# Patient Record
Sex: Male | Born: 1990 | Race: Black or African American | Hispanic: No | Marital: Single | Smoking: Never smoker
Health system: Southern US, Community
[De-identification: ages and names within clinical notes are randomized; demographics above are authoritative.]

## PROBLEM LIST (undated history)

## (undated) HISTORY — PX: OTHER SURGICAL HISTORY: SHX169

---

## 2013-03-01 ENCOUNTER — Emergency Department (HOSPITAL_COMMUNITY)
Admission: EM | Admit: 2013-03-01 | Discharge: 2013-03-01 | Disposition: A | Discharge status: Home / Self Care | Payer: BC Managed Care – PPO | Attending: Urology | Admitting: Urology

## 2013-03-01 ENCOUNTER — Encounter (HOSPITAL_COMMUNITY): Payer: Self-pay | Admitting: *Deleted

## 2013-03-01 ENCOUNTER — Encounter (HOSPITAL_COMMUNITY): Payer: Self-pay | Admitting: Certified Registered Nurse Anesthetist

## 2013-03-01 ENCOUNTER — Encounter (HOSPITAL_COMMUNITY)
Admission: EM | Disposition: A | Discharge status: Home / Self Care | Payer: Self-pay | Source: Home / Self Care | Attending: Emergency Medicine

## 2013-03-01 ENCOUNTER — Ambulatory Visit (INDEPENDENT_AMBULATORY_CARE_PROVIDER_SITE_OTHER): Payer: BC Managed Care – PPO | Admitting: Emergency Medicine

## 2013-03-01 ENCOUNTER — Emergency Department (HOSPITAL_COMMUNITY): Payer: BC Managed Care – PPO | Admitting: Certified Registered Nurse Anesthetist

## 2013-03-01 ENCOUNTER — Ambulatory Visit (HOSPITAL_COMMUNITY)
Admission: RE | Admit: 2013-03-01 | Discharge: 2013-03-01 | Disposition: A | Discharge status: Home / Self Care | Payer: BC Managed Care – PPO | Source: Ambulatory Visit | Attending: Emergency Medicine | Admitting: Emergency Medicine

## 2013-03-01 VITALS — BP 155/79 | HR 61 | Temp 98.5°F | Resp 16 | Ht 69.0 in | Wt 179.0 lb

## 2013-03-01 DIAGNOSIS — N4400 Torsion of testis, unspecified: Secondary | ICD-10-CM

## 2013-03-01 DIAGNOSIS — N50819 Testicular pain, unspecified: Secondary | ICD-10-CM

## 2013-03-01 DIAGNOSIS — F1210 Cannabis abuse, uncomplicated: Secondary | ICD-10-CM | POA: Insufficient documentation

## 2013-03-01 DIAGNOSIS — N509 Disorder of male genital organs, unspecified: Secondary | ICD-10-CM

## 2013-03-01 HISTORY — PX: SCROTAL EXPLORATION: SHX2386

## 2013-03-01 HISTORY — PX: ORCHIOPEXY: SHX479

## 2013-03-01 HISTORY — PX: ORCHIECTOMY: SHX2116

## 2013-03-01 LAB — POCT UA - MICROSCOPIC ONLY
Casts, Ur, LPF, POC: NEGATIVE
Yeast, UA: NEGATIVE

## 2013-03-01 LAB — CBC WITH DIFFERENTIAL/PLATELET
Hemoglobin: 16.1 g/dL (ref 13.0–17.0)
Lymphocytes Relative: 25 % (ref 12–46)
Lymphs Abs: 1.3 10*3/uL (ref 0.7–4.0)
MCH: 29.3 pg (ref 26.0–34.0)
Monocytes Relative: 12 % (ref 3–12)
Neutro Abs: 3.2 10*3/uL (ref 1.7–7.7)
Neutrophils Relative %: 63 % (ref 43–77)
Platelets: 175 10*3/uL (ref 150–400)
RBC: 5.5 MIL/uL (ref 4.22–5.81)
WBC: 5.1 10*3/uL (ref 4.0–10.5)

## 2013-03-01 LAB — POCT URINALYSIS DIPSTICK
Bilirubin, UA: NEGATIVE
Glucose, UA: NEGATIVE
Leukocytes, UA: NEGATIVE
Nitrite, UA: NEGATIVE
Urobilinogen, UA: 1

## 2013-03-01 LAB — BASIC METABOLIC PANEL
BUN: 9 mg/dL (ref 6–23)
CO2: 30 mEq/L (ref 19–32)
Chloride: 100 mEq/L (ref 96–112)
Glucose, Bld: 91 mg/dL (ref 70–99)
Potassium: 3.2 mEq/L — ABNORMAL LOW (ref 3.5–5.1)
Sodium: 139 mEq/L (ref 135–145)

## 2013-03-01 LAB — POCT CBC
Lymph, poc: 1.4 (ref 0.6–3.4)
MCH, POC: 28.4 pg (ref 27–31.2)
MCHC: 31.6 g/dL — AB (ref 31.8–35.4)
MID (cbc): 0.6 (ref 0–0.9)
MPV: 9.3 fL (ref 0–99.8)
POC Granulocyte: 3.2 (ref 2–6.9)
POC LYMPH PERCENT: 26.6 %L (ref 10–50)
POC MID %: 11.2 %M (ref 0–12)
Platelet Count, POC: 235 10*3/uL (ref 142–424)
RDW, POC: 12.4 %
WBC: 5.1 10*3/uL (ref 4.6–10.2)

## 2013-03-01 SURGERY — EXPLORATION, SCROTUM
Anesthesia: General | Site: Scrotum | Laterality: Right | Wound class: Clean Contaminated

## 2013-03-01 MED ORDER — SUCCINYLCHOLINE CHLORIDE 20 MG/ML IJ SOLN
INTRAMUSCULAR | Status: DC | PRN
  Administered 2013-03-01: 100 mg via INTRAVENOUS

## 2013-03-01 MED ORDER — MORPHINE SULFATE 4 MG/ML IJ SOLN
4.0000 mg | Freq: Once | INTRAMUSCULAR | Status: AC
  Administered 2013-03-01: 4 mg via INTRAVENOUS
  Filled 2013-03-01: qty 1

## 2013-03-01 MED ORDER — FENTANYL CITRATE 0.05 MG/ML IJ SOLN
INTRAMUSCULAR | Status: DC | PRN
  Administered 2013-03-01: 100 ug via INTRAVENOUS
  Administered 2013-03-01 (×3): 50 ug via INTRAVENOUS

## 2013-03-01 MED ORDER — OXYCODONE-ACETAMINOPHEN 5-325 MG PO TABS
ORAL_TABLET | ORAL | Status: AC
  Administered 2013-03-01: 1
  Filled 2013-03-01: qty 1

## 2013-03-01 MED ORDER — OXYCODONE-ACETAMINOPHEN 10-325 MG PO TABS: 1.0000 | ORAL_TABLET | ORAL | Status: DC | PRN

## 2013-03-01 MED ORDER — PROPOFOL 10 MG/ML IV BOLUS
INTRAVENOUS | Status: DC | PRN
  Administered 2013-03-01: 200 mg via INTRAVENOUS

## 2013-03-01 MED ORDER — LACTATED RINGERS IV SOLN
INTRAVENOUS | Status: DC | PRN
  Administered 2013-03-01: 19:00:00 via INTRAVENOUS

## 2013-03-01 MED ORDER — HYDROMORPHONE HCL PF 1 MG/ML IJ SOLN: 0.2500 mg | INTRAMUSCULAR | Status: DC | PRN

## 2013-03-01 MED ORDER — 0.9 % SODIUM CHLORIDE (POUR BTL) OPTIME
TOPICAL | Status: DC | PRN
  Administered 2013-03-01: 1000 mL

## 2013-03-01 MED ORDER — OXYCODONE HCL 5 MG PO TABS
ORAL_TABLET | ORAL | Status: AC
  Administered 2013-03-01: 5 mg via ORAL
  Filled 2013-03-01: qty 1

## 2013-03-01 MED ORDER — ONDANSETRON HCL 4 MG/2ML IJ SOLN
4.0000 mg | Freq: Once | INTRAMUSCULAR | Status: AC
  Administered 2013-03-01: 4 mg via INTRAVENOUS
  Filled 2013-03-01: qty 2

## 2013-03-01 MED ORDER — BUPIVACAINE-EPINEPHRINE 0.5% -1:200000 IJ SOLN
INTRAMUSCULAR | Status: DC | PRN
  Administered 2013-03-01: 10 mL

## 2013-03-01 MED ORDER — CEFAZOLIN SODIUM-DEXTROSE 2-3 GM-% IV SOLR
2.0000 g | Freq: Once | INTRAVENOUS | Status: DC
  Filled 2013-03-01: qty 50

## 2013-03-01 MED ORDER — MIDAZOLAM HCL 5 MG/5ML IJ SOLN
INTRAMUSCULAR | Status: DC | PRN
  Administered 2013-03-01: 2 mg via INTRAVENOUS

## 2013-03-01 MED ORDER — LIDOCAINE HCL (CARDIAC) 20 MG/ML IV SOLN
INTRAVENOUS | Status: DC | PRN
  Administered 2013-03-01: 40 mg via INTRAVENOUS

## 2013-03-01 MED ORDER — OXYCODONE HCL 5 MG PO TABS: 5.0000 mg | ORAL_TABLET | Freq: Once | ORAL | Status: AC | PRN

## 2013-03-01 MED ORDER — HYDROMORPHONE HCL PF 1 MG/ML IJ SOLN
INTRAMUSCULAR | Status: AC
  Administered 2013-03-01: 0.5 mg via INTRAVENOUS
  Filled 2013-03-01: qty 1

## 2013-03-01 MED ORDER — OXYCODONE HCL 5 MG/5ML PO SOLN: 5.0000 mg | Freq: Once | ORAL | Status: AC | PRN

## 2013-03-01 SURGICAL SUPPLY — 32 items
BAG URO CATCHER STRL LF (DRAPE) ×5 IMPLANT
BANDAGE GAUZE ELAST BULKY 4 IN (GAUZE/BANDAGES/DRESSINGS) ×5 IMPLANT
BLADE SURG 15 STRL LF DISP TIS (BLADE) ×8 IMPLANT
BLADE SURG 15 STRL SS (BLADE) ×2
CANISTER SUCTION 2500CC (MISCELLANEOUS) ×5 IMPLANT
CLOTH BEACON ORANGE TIMEOUT ST (SAFETY) ×5 IMPLANT
CONT SPEC 4OZ CLIKSEAL STRL BL (MISCELLANEOUS) ×5 IMPLANT
DRAPE LAPAROTOMY TRNSV 102X78 (DRAPE) ×5 IMPLANT
DRSG PAD ABDOMINAL 8X10 ST (GAUZE/BANDAGES/DRESSINGS) ×10 IMPLANT
ELECT REM PT RETURN 9FT ADLT (ELECTROSURGICAL) ×5
ELECTRODE REM PT RTRN 9FT ADLT (ELECTROSURGICAL) ×4 IMPLANT
GLOVE BIO SURGEON STRL SZ7.5 (GLOVE) ×5 IMPLANT
GLOVE BIO SURGEON STRL SZ8 (GLOVE) ×5 IMPLANT
GLOVE ORTHO TXT STRL SZ7.5 (GLOVE) ×5 IMPLANT
GOWN PREVENTION PLUS XLARGE (GOWN DISPOSABLE) ×5 IMPLANT
KIT BASIN OR (CUSTOM PROCEDURE TRAY) ×5 IMPLANT
KIT ROOM TURNOVER OR (KITS) ×5 IMPLANT
NEEDLE HYPO 25GX1X1/2 BEV (NEEDLE) ×5 IMPLANT
NS IRRIG 1000ML POUR BTL (IV SOLUTION) ×5 IMPLANT
PACK CYSTO (CUSTOM PROCEDURE TRAY) IMPLANT
PACK GENERAL/GYN (CUSTOM PROCEDURE TRAY) ×5 IMPLANT
PAD ARMBOARD 7.5X6 YLW CONV (MISCELLANEOUS) ×10 IMPLANT
SOLUTION BETADINE 4OZ (MISCELLANEOUS) ×5 IMPLANT
SPONGE GAUZE 4X4 12PLY (GAUZE/BANDAGES/DRESSINGS) ×5 IMPLANT
SPONGE LAP 18X18 X RAY DECT (DISPOSABLE) IMPLANT
SUT CHROMIC 3 0 SH 27 (SUTURE) ×10 IMPLANT
SUT SILK 3 0 SH 30 (SUTURE) ×30 IMPLANT
SWAB COLLECTION DEVICE MRSA (MISCELLANEOUS) IMPLANT
SYR CONTROL 10ML LL (SYRINGE) ×5 IMPLANT
TOWEL OR 17X24 6PK STRL BLUE (TOWEL DISPOSABLE) ×5 IMPLANT
TOWEL OR 17X26 10 PK STRL BLUE (TOWEL DISPOSABLE) ×5 IMPLANT
WATER STERILE IRR 1000ML POUR (IV SOLUTION) IMPLANT

## 2013-03-01 NOTE — Preoperative (Signed)
Beta Blockers   Reason not to administer Beta Blockers:Not Applicable 

## 2013-03-01 NOTE — Progress Notes (Signed)
[facility] [address], [place] [address] [phone]  Date:  03/01/2013   Name:  Nijah Orlich   DOB:  March 21, 1991   MRN:  [id number]  PCP:  No PCP Per Patient    Chief Complaint: Testicle Pain and Emesis   History of Present Illness:  Beth Spackman is a 22 y.o. very pleasant male patient who presents with the following:  Awakened in night with pain in testicle.  Says that he began to vomit 4-5 times and then stopped. No nausea today.  Has no stool change.  Denies fever or chills.  No urethral discharge, dysuria.  No hesitancy or frequency or urgency.  No new sexual contacts recently.  No improvement with over the counter medications or other home remedies. Denies other complaint or health concern today.   There is no problem list on file for this patient.   History reviewed. No pertinent past medical history.  Past Surgical History  Procedure Laterality Date  . Tosillectomy Bilateral     History  Substance Use Topics  . Smoking status: Never Smoker   . Smokeless tobacco: Not on file  . Alcohol Use: Yes    History reviewed. No pertinent family history.  No Known Allergies  Medication list has been reviewed and updated.  No current outpatient prescriptions on file prior to visit.   No current facility-administered medications on file prior to visit.    Review of Systems:  As per HPI, otherwise negative.    Physical Examination: Filed Vitals:   03/01/13 1500  BP: 155/79  Pulse: 61  Temp: 98.5 F (36.9 C)  Resp: 16   Filed Vitals:   03/01/13 1500  Height: 5\' 9"  (1.753 m)  Weight: 179 lb (81.194 kg)   Body mass index is 26.42 kg/(m^2). Ideal Body Weight: Weight in (lb) to have BMI = 25: 168.9   GEN: WDWN, NAD, Non-toxic, Alert & Oriented x 3 HEENT: Atraumatic, Normocephalic.  Ears and Nose: No external deformity. EXTR: No clubbing/cyanosis/edema NEURO: Normal gait.  PSYCH: Normally interactive. Conversant. Not  depressed or anxious appearing.  Calm demeanor.  Genitalia:  Large tender, firm left testicle not able to distinguish between testicle and epididymis.  Cord thicker and tender. Left testicle high in scrotum  Assessment and Plan: Torsion vs epididymis Ultrasound   Signed,  Phillips Odor, MD   Sent to ER for admission after receipt of sono report.  Has torsion since midnight (17 hours.)

## 2013-03-01 NOTE — H&P (Signed)
John Cooke is an 22 y.o. male.    HPI:  He reports awakening at 12:30 AM this morning with left testicular pain. It was severe and caused him to become nauseated and he then vomited. After vomiting he said the pain seemed to improve somewhat he went back to sleep. When he awoke later in the morning he noted he was still having left testicular pain and swelling. He went to an urgent care where a scrotal ultrasound was obtained. This revealed absence of flow to the left testicle but otherwise no significant testicular pathology was identified. He continues to have a moderate amount of pain and some swelling. He has no prior history of testicular pain, significant trauma or infection. He denies any voiding symptoms and has never had a UTI. His pain is currently mild to moderate in severity. It is not modified by positional change. No other associated signs or symptoms.  History reviewed. No pertinent past medical history.  Past Surgical History  Procedure Laterality Date  . Tosillectomy Bilateral   He also had his wisdom teeth removed. Medications: None Allergies: No Known Allergies  No family history on file.  Social History:  reports that he has never smoked. He does not have any smokeless tobacco history on file. He reports that  drinks alcohol. He reports that he uses illicit drugs (Marijuana).  Review of Systems: Pertinent items are noted in HPI. A comprehensive review of systems was negative except for: as noted above  Results for orders placed in visit on 03/01/13 (from the past 48 hour(s))  POCT CBC     Status: Abnormal   Collection Time    03/01/13  3:28 PM      Result Value Range   WBC 5.1  4.6 - 10.2 K/uL   Lymph, poc 1.4  0.6 - 3.4   POC LYMPH PERCENT 26.6  10 - 50 %L   MID (cbc) 0.6  0 - 0.9   POC MID % 11.2  0 - 12 %M   POC Granulocyte 3.2  2 - 6.9   Granulocyte percent 62.2  37 - 80 %G   RBC 5.64  4.69 - 6.13 M/uL   Hemoglobin 16.0  14.1 - 18.1 g/dL   HCT, POC  47.8  29.5 - 53.7 %   MCV 89.7  80 - 97 fL   MCH, POC 28.4  27 - 31.2 pg   MCHC 31.6 (*) 31.8 - 35.4 g/dL   RDW, POC 62.1     Platelet Count, POC 235  142 - 424 K/uL   MPV 9.3  0 - 99.8 fL  POCT URINALYSIS DIPSTICK     Status: None   Collection Time    03/01/13  3:29 PM      Result Value Range   Color, UA yellow     Clarity, UA clear     Glucose, UA negative     Bilirubin, UA negative     Ketones, UA negative     Spec Grav, UA 1.020     Blood, UA negative     pH, UA 7.5     Protein, UA trace     Urobilinogen, UA 1.0     Nitrite, UA negative     Leukocytes, UA Negative    POCT UA - MICROSCOPIC ONLY     Status: None   Collection Time    03/01/13  3:33 PM      Result Value Range   WBC, Ur, HPF, POC 0-1  RBC, urine, microscopic 1-2     Bacteria, U Microscopic negative     Mucus, UA trace     Epithelial cells, urine per micros 0-1     Crystals, Ur, HPF, POC negative     Casts, Ur, LPF, POC negative     Yeast, UA negative      US Scrotum  03/01/2013  *RADIOLOGY REPORT*  Clinical Data:  Left testicular pain  SCROTAL ULTRASOUND DOPPLER ULTRASOUND OF THE TESTICLES  Technique: Complete ultrasound examination of the testicles, epididymis, and other scrotal structures was performed.  Color and spectral Doppler ultrasound were also utilized to evaluate blood flow to the testicles.  Comparison:  None.  Findings:  Right testis:  Normal in size appearance, measuring 4.9 x 2.2 x 2.9 cm.  Left testis:  Mildly enlarged with heterogeneous echotexture, measuring 4.8 x 2.8 x 3.7 cm.  Right epididymis:  Normal in size and appearance.  Left epididymis:  Normal in size and appearance.  Hydrocele:  Absent.  Varicocele:  Absent.  Pulsed Doppler interrogation of both testes demonstrates normal low resistance flow in the right testis.  Lack of demonstrable color or spectral Doppler flow in the left testis.  IMPRESSION: Lack of color or spectral Doppler flow in the left testis, highly suspicious for left  testicular torsion.  Critical Value/emergent results were called by telephone at the time of interpretation on 03/01/2013 at 1645 hours to Dr. Dareen Piano, who verbally acknowledged these results.  At his request, the patient was notified directly and transported to the ED.   Original Report Authenticated By: Charline Bills, M.D.    [place] Art/ven Flow Abd Pelv Doppler  03/01/2013  *RADIOLOGY REPORT*  Clinical Data:  Left testicular pain  SCROTAL ULTRASOUND DOPPLER ULTRASOUND OF THE TESTICLES  Technique: Complete ultrasound examination of the testicles, epididymis, and other scrotal structures was performed.  Color and spectral Doppler ultrasound were also utilized to evaluate blood flow to the testicles.  Comparison:  None.  Findings:  Right testis:  Normal in size appearance, measuring 4.9 x 2.2 x 2.9 cm.  Left testis:  Mildly enlarged with heterogeneous echotexture, measuring 4.8 x 2.8 x 3.7 cm.  Right epididymis:  Normal in size and appearance.  Left epididymis:  Normal in size and appearance.  Hydrocele:  Absent.  Varicocele:  Absent.  Pulsed Doppler interrogation of both testes demonstrates normal low resistance flow in the right testis.  Lack of demonstrable color or spectral Doppler flow in the left testis.  IMPRESSION: Lack of color or spectral Doppler flow in the left testis, highly suspicious for left testicular torsion.  Critical Value/emergent results were called by telephone at the time of interpretation on 03/01/2013 at 1645 hours to Dr. Dareen Piano, who verbally acknowledged these results.  At his request, the patient was notified directly and transported to the ED.   Original Report Authenticated By: Charline Bills, M.D.     Temp:  [98.5 F (36.9 C)-99.6 F (37.6 C)] 99.6 F (37.6 C) (04/10 1659) Pulse Rate:  [61-68] 68 (04/10 1659) Resp:  [16] 16 (04/10 1659) BP: (153-155)/(67-79) 153/67 mmHg (04/10 1659) SpO2:  [99 %-100 %] 100 % (04/10 1659) Weight:  [81.194 kg (179 lb)] 81.194 kg (179  lb) (04/10 1659)  Physical Exam: General appearance: alert and appears stated age Head: Normocephalic, without obvious abnormality, atraumatic Eyes: conjunctivae/corneas clear. EOM's intact.  Oropharynx: moist mucous membranes Neck: supple, symmetrical, trachea midline Resp: normal respiratory effort Cardio: regular rate and rhythm Back: symmetric, no curvature. ROM normal. No  CVA tenderness. GI: soft, non-tender; bowel sounds normal; no masses,  no organomegaly Male genitalia: penis: normal male phallus with no lesions or discharge. He is circumcised. Testes: bilaterally descended. The right testicle is normal to palpation and location. The left testicle is high riding. There is a lack of cremasteric reflex. It is tender to palpation and is laying somewhat obliquely within the scrotum.  Extremities: extremities normal, atraumatic, no cyanosis or edema Skin: Skin color normal. No visible rashes or lesions Neurologic: Grossly normal  Assessment: I independently reviewed his scrotal ultrasound images. He appears to have a left testicular torsion. There does not appear to be any significant left testicular pathology otherwise. I  Have discussed with the patient the fact that it appears almost certain he has a left testicular torsion. There are other conditions that can potentially mimic this such as neoplasm and infection although he does not appear to have either of these by ultrasound and his urine is clear. I therefore have discussed the emergent need for scrotal exploration with detorsion of his left testicle and bilateral testicular fixation. We discussed the rationale behind this and the fact that at 18 hours from the time of his portion of occurring there is a possibility that his testicle is nonviable any longer and therefore, if this was found to be the case at the time of surgery, I would proceed with a left orchiectomy. He understands this as well as the fact that placement of a  testicular prosthesis is an option however he has not elected to proceed with that at this time. I went over the incision used, the risks and complications as well as the probability of success. We also discussed anticipated postoperative course. He understands and has elected to proceed.  Plan: He will be taken immediately to the operating room for scrotal exploration, left testicular detorsion and bilateral testicular fixation with possible left orchiectomy.  Garnett Farm 03/01/2013, 6:06 PM

## 2013-03-01 NOTE — Patient Instructions (Addendum)
You can go today for your ultrasound to [place] go to Radiology department.

## 2013-03-01 NOTE — Op Note (Signed)
PATIENT:  John Cooke  PRE-OPERATIVE DIAGNOSIS: Left testicular torsion  POST-OPERATIVE DIAGNOSIS:  Same  PROCEDURE:  Procedure(s): 1. Scrotal exploration. 2. Left orchiectomy 3. Right orchiopexy  SURGEON:  Garnett Farm  INDICATION: The patient is a 22 year old male who experienced acute onset left testicular pain 18 hours prior to surgery. He was seen and evaluated and a scrotal ultrasound revealed no flow to the testicle on the left side. We discussed the fact that it appeared he had testicular torsion and that surgical intervention was indicated.  ANESTHESIA:  General  EBL:  Minimal  DRAINS: None  LOCAL MEDICATIONS USED:  10 cc of 1/2% Marcaine with epinephrine  SPECIMEN:  Left testicle  DISPOSITION OF SPECIMEN:  Pathology  Description of procedure: After informed consent the patient was brought to the major OR, placed on the table and administered general anesthesia. His genitalia was then sterilely prepped and draped. An official timeout was then performed.  A midline median raphae scrotal incision was then made and carried down over the left testicle. It appeared purple and once the testicle was delivered it was clearly ischemic. It was detorsed and wrapped in warm, wet lap sponges. I then turned my attention to the right hemiscrotum.  The right testicle was delivered and appeared entirely normal. The appendix testis was fulgurated with the Bovie. I then placed 3-0 silk sutures at the 3:00, 6:00 and 9:00 positions in the testicle. These were then placed through the scrotal wall at the corresponding locations and the median septum. The testicle was then replaced in the right hemiscrotum and each of the sutures was tied.   Attention was then redirected to the left testicle. It was noted to be ischemic and purple with no evidence of return of blood flow. I used a scalpel to incise the testicular tunica and noted no bleeding whatsoever. It was therefore felt the testicle was  nonviable and I proceeded with an orchiectomy. I used Kelly clamps to isolate the cord into 2 equal portions and clamped the cord. I divided the cord distal to the clamps. I then used a 2-0 Vicryl suture ligature followed by a 2-0 silk tie on each of the isolated cord segments. I then checked for bleeding and noted none.  I then closed the deep scrotal tissue with running 3-0 chromic suture in a locking fashion. I injected one half percent Marcaine with epinephrine in the subcutaneous tissue and closed the skin with running 3-0 chromic. Neosporin, a sterile gauze dressing, fluff Kerlix and a scrotal support were applied. The patient tolerated the procedure well no intraoperative complications. Needle sponge and instrument counts were correct at the end of the operation.   PLAN OF CARE: Discharge to home after PACU  PATIENT DISPOSITION:  PACU - hemodynamically stable.

## 2013-03-01 NOTE — ED Notes (Signed)
Pt sent here from Urgent Care for + L testicular torsion on [place].  Began feeling pain last night.

## 2013-03-01 NOTE — ED Provider Notes (Signed)
History     CSN: [id number]  Arrival date & time 03/01/13  1655   First MD Initiated Contact with Patient 03/01/13 1725      Chief Complaint  Patient presents with  . Testicle Pain    testicular torsion    (Consider location/radiation/quality/duration/timing/severity/associated sxs/prior treatment) HPI Comments: Pt was awoken last night at midnight with left testicular pain and associated nausea vomiting.  Vomited 4 times.  The pain subsided after 2 hours and returned this morning after he woke up. Pain is currently 4/10.  Denies fevers, chills, abdominal pain, bowel changes, urinary symptoms, penile discharge.   Level V caveat for severity of condition, need for urgent intervention.   The history is provided by the patient and medical records.    History reviewed. No pertinent past medical history.  Past Surgical History  Procedure Laterality Date  . Tosillectomy Bilateral     No family history on file.  History  Substance Use Topics  . Smoking status: Never Smoker   . Smokeless tobacco: Not on file  . Alcohol Use: Yes      Review of Systems  Unable to perform ROS: Acuity of condition  Constitutional: Negative for fever and chills.  Respiratory: Negative for cough and shortness of breath.   Cardiovascular: Negative for chest pain.  Gastrointestinal: Positive for nausea and vomiting. Negative for abdominal pain and diarrhea.  Genitourinary: Positive for scrotal swelling and testicular pain. Negative for dysuria, urgency, frequency, penile swelling and penile pain.    Allergies  Review of patient's allergies indicates no known allergies.  Home Medications  No current outpatient prescriptions on file.  BP 153/67  Pulse 68  Temp(Src) 99.6 F (37.6 C) (Oral)  Resp 16  Ht 5\' 9"  (1.753 m)  Wt 179 lb (81.194 kg)  BMI 26.42 kg/m2  SpO2 100%  Physical Exam  Nursing note and vitals reviewed. Constitutional: He appears well-developed and well-nourished. No  distress.  HENT:  Head: Normocephalic and atraumatic.  Neck: Neck supple.  Pulmonary/Chest: Effort normal.  Genitourinary: Penis normal. Right testis shows no mass, no swelling and no tenderness. Right testis is descended. Left testis shows mass, swelling and tenderness. Cremasteric reflex is absent on the left side. Circumcised.  Left testicle high riding.   Musculoskeletal: Normal range of motion.  Neurological: He is alert.  Skin: He is not diaphoretic.  Psychiatric: He has a normal mood and affect. His behavior is normal.    ED Course  Procedures (including critical care time)  Labs Reviewed - No data to display US Scrotum  03/01/2013  *RADIOLOGY REPORT*  Clinical Data:  Left testicular pain  SCROTAL ULTRASOUND DOPPLER ULTRASOUND OF THE TESTICLES  Technique: Complete ultrasound examination of the testicles, epididymis, and other scrotal structures was performed.  Color and spectral Doppler ultrasound were also utilized to evaluate blood flow to the testicles.  Comparison:  None.  Findings:  Right testis:  Normal in size appearance, measuring 4.9 x 2.2 x 2.9 cm.  Left testis:  Mildly enlarged with heterogeneous echotexture, measuring 4.8 x 2.8 x 3.7 cm.  Right epididymis:  Normal in size and appearance.  Left epididymis:  Normal in size and appearance.  Hydrocele:  Absent.  Varicocele:  Absent.  Pulsed Doppler interrogation of both testes demonstrates normal low resistance flow in the right testis.  Lack of demonstrable color or spectral Doppler flow in the left testis.  IMPRESSION: Lack of color or spectral Doppler flow in the left testis, highly suspicious for left testicular torsion.  Critical Value/emergent results were called by telephone at the time of interpretation on 03/01/2013 at 1645 hours to Dr. Dareen Piano, who verbally acknowledged these results.  At his request, the patient was notified directly and transported to the ED.   Original Report Authenticated By: Charline Bills, M.D.     [place] Art/ven Flow Abd Pelv Doppler  03/01/2013  *RADIOLOGY REPORT*  Clinical Data:  Left testicular pain  SCROTAL ULTRASOUND DOPPLER ULTRASOUND OF THE TESTICLES  Technique: Complete ultrasound examination of the testicles, epididymis, and other scrotal structures was performed.  Color and spectral Doppler ultrasound were also utilized to evaluate blood flow to the testicles.  Comparison:  None.  Findings:  Right testis:  Normal in size appearance, measuring 4.9 x 2.2 x 2.9 cm.  Left testis:  Mildly enlarged with heterogeneous echotexture, measuring 4.8 x 2.8 x 3.7 cm.  Right epididymis:  Normal in size and appearance.  Left epididymis:  Normal in size and appearance.  Hydrocele:  Absent.  Varicocele:  Absent.  Pulsed Doppler interrogation of both testes demonstrates normal low resistance flow in the right testis.  Lack of demonstrable color or spectral Doppler flow in the left testis.  IMPRESSION: Lack of color or spectral Doppler flow in the left testis, highly suspicious for left testicular torsion.  Critical Value/emergent results were called by telephone at the time of interpretation on 03/01/2013 at 1645 hours to Dr. Dareen Piano, who verbally acknowledged these results.  At his request, the patient was notified directly and transported to the ED.   Original Report Authenticated By: Charline Bills, M.D.    5:27 PM Awaiting patient in exam room.  I have paged urology as [place] report available to me.    5:33 PM I spoke with Dr Vernie Ammons who is on his way to see patient.  Pt now in exam room.   6:10 PM Dr Vernie Ammons is in ED, has seen and examined patient, and is preparing to take pt to OR.    1. Testicular torsion     MDM  Pt presented to ED with testicular torsion, sent from [facility] with ultrasound positive for torsion.  I called urology prior to the patient's arrival to my area in the ED after reviewing the ultrasound.  Exam consistent with torsion.  Dr Vernie Ammons (urology) to take pt to OR.  Pt  verbalizes understanding and agrees with plan.         Trixie Dredge, PA-C 03/01/13 1813

## 2013-03-01 NOTE — Transfer of Care (Signed)
Immediate Anesthesia Transfer of Care Note  Patient: Public affairs consultant  Procedure(s) Performed: Procedure(s): SCROTUM EXPLORATION (Bilateral) ORCHIOPEXY ADULT (Right) ORCHIECTOMY (Left)  Patient Location: PACU  Anesthesia Type:General  Level of Consciousness: awake, alert , oriented and patient cooperative  Airway & Oxygen Therapy: Patient Spontanous Breathing and Patient connected to nasal cannula oxygen  Post-op Assessment: Report given to PACU RN, Post -op Vital signs reviewed and stable and Patient moving all extremities  Post vital signs: Reviewed and stable  Complications: No apparent anesthesia complications

## 2013-03-01 NOTE — Anesthesia Procedure Notes (Signed)
Procedure Name: Intubation Date/Time: 03/01/2013 7:03 PM Performed by: Angelica Pou Pre-anesthesia Checklist: Patient identified, Timeout performed, Emergency Drugs available, Suction available and Patient being monitored Patient Re-evaluated:Patient Re-evaluated prior to inductionOxygen Delivery Method: Circle system utilized Preoxygenation: Pre-oxygenation with 100% oxygen Intubation Type: IV induction, Rapid sequence and Cricoid Pressure applied Laryngoscope Size: Mac and 3 Grade View: Grade I Tube type: Oral Tube size: 7.5 mm Number of attempts: 1 Airway Equipment and Method: Stylet Placement Confirmation: ETT inserted through vocal cords under direct vision,  breath sounds checked- equal and bilateral and positive ETCO2 Secured at: 23 cm Tube secured with: Tape Dental Injury: Teeth and Oropharynx as per pre-operative assessment

## 2013-03-01 NOTE — ED Notes (Addendum)
Pt c/o left testicular pain that started around midnight then went away for a few hrs, pt fell back asleep and then the pain continued. Pt sts the pain has decreased but still very sensitive to touch. Pt went to urgent care and was told he had a testicular torsion and to go to [facility]. Pt in nad, ambulated to room with no issues, skin warm and dry, resp e/u.

## 2013-03-01 NOTE — ED Notes (Signed)
Pt reports he hasn't eaten anything since before midnight but had 1 potato chip in the waiting room.

## 2013-03-01 NOTE — Anesthesia Postprocedure Evaluation (Signed)
  Anesthesia Post-op Note  Patient: Public affairs consultant  Procedure(s) Performed: Procedure(s): SCROTUM EXPLORATION (Bilateral) ORCHIOPEXY ADULT (Right) ORCHIECTOMY (Left)  Patient Location: PACU  Anesthesia Type:General  Level of Consciousness: awake, alert  and oriented  Airway and Oxygen Therapy: Patient Spontanous Breathing and Patient connected to nasal cannula oxygen  Post-op Pain: mild  Post-op Assessment: Post-op Vital signs reviewed, Patient's Cardiovascular Status Stable, Respiratory Function Stable, Patent Airway and No signs of Nausea or vomiting  Post-op Vital Signs: Reviewed and stable  Complications: No apparent anesthesia complications

## 2013-03-01 NOTE — ED Provider Notes (Signed)
22 year old male who comes in today with an onset of testicular pain at approximately midnight last night which is 17 hours after the pain started. He was seen at an urgent care center and had an ultrasound which shows torsion of the left testicle. Dr. Vernie Ammons has been consult and then has seen the patient is taking him to the operating room.  I performed a history and physical examination of John Cooke and discussed his management with Trixie Dredge, PA C.  I agree with the history, physical, assessment, and plan of care, with the following exceptions: None  I was present for the following procedures: None Time Spent in Critical Care of the patient: None Time spent in discussions with the patient and family: 10  Joceline Hinchcliff Corlis Leak, MD 03/01/13 1827

## 2013-03-01 NOTE — Anesthesia Preprocedure Evaluation (Addendum)
Anesthesia Evaluation  Patient identified by MRN, date of birth, ID band Patient awake    Reviewed: Allergy & Precautions, H&P , NPO status , Patient's Chart, lab work & pertinent test results  History of Anesthesia Complications Negative for: history of anesthetic complications  Airway Mallampati: I TM Distance: >3 FB Neck ROM: Full    Dental no notable dental hx. (+) Teeth Intact and Dental Advisory Given   Pulmonary neg pulmonary ROS,  breath sounds clear to auscultation  Pulmonary exam normal       Cardiovascular Exercise Tolerance: Good negative cardio ROS  Rhythm:Regular Rate:Normal     Neuro/Psych negative neurological ROS  negative psych ROS   GI/Hepatic negative GI ROS, Neg liver ROS,   Endo/Other  negative endocrine ROS  Renal/GU negative Renal ROS  negative genitourinary   Musculoskeletal negative musculoskeletal ROS (+)   Abdominal   Peds negative pediatric ROS (+)  Hematology negative hematology ROS (+)   Anesthesia Other Findings   Reproductive/Obstetrics negative OB ROS                          Anesthesia Physical Anesthesia Plan  ASA: I and emergent  Anesthesia Plan: General   Post-op Pain Management:    Induction: Intravenous, Rapid sequence and Cricoid pressure planned  Airway Management Planned: Oral ETT  Additional Equipment:   Intra-op Plan:   Post-operative Plan: Extubation in OR  Informed Consent: I have reviewed the patients History and Physical, chart, labs and discussed the procedure including the risks, benefits and alternatives for the proposed anesthesia with the patient or authorized representative who has indicated his/her understanding and acceptance.   Dental advisory given  Plan Discussed with: CRNA  Anesthesia Plan Comments:         Anesthesia Quick Evaluation

## 2013-03-02 ENCOUNTER — Encounter (HOSPITAL_COMMUNITY): Payer: Self-pay | Admitting: Urology

## 2013-03-02 LAB — GC/CHLAMYDIA PROBE AMP: GC Probe RNA: NEGATIVE

## 2013-03-02 MED FILL — Cefazolin Sodium for IV Soln 2 GM and Dextrose 3% (50 ML): INTRAVENOUS | Qty: 50 | Status: AC

## 2013-03-05 NOTE — ED Provider Notes (Signed)
See my note  Hilario Quarry, MD 03/05/13 1212

## 2014-09-19 ENCOUNTER — Ambulatory Visit (INDEPENDENT_AMBULATORY_CARE_PROVIDER_SITE_OTHER): Payer: BC Managed Care – PPO | Admitting: Family Medicine

## 2014-09-19 VITALS — BP 130/84 | HR 83 | Temp 98.9°F | Resp 20 | Ht 69.0 in | Wt 180.6 lb

## 2014-09-19 DIAGNOSIS — R059 Cough, unspecified: Secondary | ICD-10-CM

## 2014-09-19 DIAGNOSIS — J069 Acute upper respiratory infection, unspecified: Secondary | ICD-10-CM

## 2014-09-19 DIAGNOSIS — R05 Cough: Secondary | ICD-10-CM

## 2014-09-19 MED ORDER — HYDROCOD POLST-CHLORPHEN POLST 10-8 MG/5ML PO LQCR: 5.0000 mL | Freq: Two times a day (BID) | ORAL | Status: DC | PRN

## 2014-09-19 NOTE — Progress Notes (Signed)
Subjective:    Patient ID: John Cooke, male    DOB: 12/14/1990, 23 y.o.   MRN: [id number]  09/19/2014  Cough   HPI This 23 y.o. male presents for evaluation of cold symptoms. Onset four days ago.  No fever/chills; +sweats.  +HA.  No ear pain.  No sore throat.  +rhinorrhea; +nasal congestion   +coughing a lot. No sputum.  No SOB.  No asthma; no tobacco.  Tried sinus medication and cough medication.   Requesting Tussionex which is what grandmother previously gave patient.  Grandmother with bad coughing spells and takes Tussionex. Mother also coughs a lot at night.  Pt will cough a lot for two to three weeks with each illness.  Every time pt gets sick, gets a bad cough.  Uncle with asthma.  No seasonal allergies.  No DOE with exercise.     Review of Systems  Constitutional: Positive for diaphoresis and fatigue. Negative for fever and chills.  HENT: Positive for congestion and rhinorrhea. Negative for ear pain, postnasal drip and sore throat.   Respiratory: Positive for cough. Negative for shortness of breath and wheezing.   Gastrointestinal: Negative for nausea, vomiting and diarrhea.  Skin: Negative for rash.  Neurological: Positive for headaches.    History reviewed. No pertinent past medical history. Past Surgical History  Procedure Laterality Date  . Tosillectomy Bilateral   . Scrotal exploration Bilateral 03/01/2013    Procedure: SCROTUM EXPLORATION;  Surgeon: Garnett FarmMark C Ottelin, MD;  [facility];  Service: Urology;  Laterality: Bilateral;  . Orchiopexy Right 03/01/2013    Procedure: ORCHIOPEXY ADULT;  Surgeon: Garnett FarmMark C Ottelin, MD;  [facility];  Service: Urology;  Laterality: Right;  . Orchiectomy Left 03/01/2013    Procedure: ORCHIECTOMY;  Surgeon: Garnett FarmMark C Ottelin, MD;  [facility];  Service: Urology;  Laterality: Left;   No Known Allergies Current Outpatient Prescriptions  Medication Sig Dispense Refill  . chlorpheniramine-HYDROcodone (TUSSIONEX) 10-8 MG/5ML LQCR Take  5 mLs by mouth every 12 (twelve) hours as needed for cough.  180 mL  0   No current facility-administered medications for this visit.       Objective:    BP 130/84  Pulse 83  Temp(Src) 98.9 F (37.2 C) (Oral)  Resp 20  Ht 5\' 9"  (1.753 m)  Wt 180 lb 9.6 oz (81.92 kg)  BMI 26.66 kg/m2  SpO2 98% Physical Exam  Constitutional: He is oriented to person, place, and time. He appears well-developed and well-nourished. No distress.  HENT:  Head: Normocephalic and atraumatic.  Right Ear: External ear normal.  Left Ear: External ear normal.  Nose: Mucosal edema and rhinorrhea present. Right sinus exhibits no maxillary sinus tenderness and no frontal sinus tenderness. Left sinus exhibits no maxillary sinus tenderness and no frontal sinus tenderness.  Mouth/Throat: Oropharynx is clear and moist.  Eyes: Conjunctivae and EOM are normal. Pupils are equal, round, and reactive to light.  Neck: Normal range of motion. Neck supple.  Cardiovascular: Normal rate, regular rhythm and normal heart sounds.  Exam reveals no gallop and no friction rub.   No murmur heard. Pulmonary/Chest: Effort normal and breath sounds normal. He has no wheezes. He has no rales.  Lymphadenopathy:    He has no cervical adenopathy.  Neurological: He is alert and oriented to person, place, and time.  Skin: No rash noted. He is not diaphoretic.  Psychiatric: He has a normal mood and affect. His behavior is normal.        Assessment &  Plan:   1. Acute upper respiratory infection   2. Cough     1. URI:  New.  Rx for Tussionex provided; continue sinus cold & congestion OTC medication; pt declined rx for nasal spray.  Call if no improvement in 1-2 weeks.   Meds ordered this encounter  Medications  . chlorpheniramine-HYDROcodone (TUSSIONEX) 10-8 MG/5ML LQCR    Sig: Take 5 mLs by mouth every 12 (twelve) hours as needed for cough.    Dispense:  180 mL    Refill:  0    No Follow-up on file.    Nilda SimmerKristi Smith,  M.D.  [facility]  [place] [address] [place], [place]  [id number] [phone] phone [phone] fax

## 2014-09-19 NOTE — Patient Instructions (Signed)
Upper Respiratory Infection, Adult An upper respiratory infection (URI) is also sometimes known as the common cold. The upper respiratory tract includes the nose, sinuses, throat, trachea, and bronchi. Bronchi are the airways leading to the lungs. Most people improve within 1 week, but symptoms can last up to 2 weeks. A residual cough may last even longer.  CAUSES Many different viruses can infect the tissues lining the upper respiratory tract. The tissues become irritated and inflamed and often become very moist. Mucus production is also common. A cold is contagious. You can easily spread the virus to others by oral contact. This includes kissing, sharing a glass, coughing, or sneezing. Touching your mouth or nose and then touching a surface, which is then touched by another person, can also spread the virus. SYMPTOMS  Symptoms typically develop 1 to 3 days after you come in contact with a cold virus. Symptoms vary from person to person. They may include:  Runny nose.  Sneezing.  Nasal congestion.  Sinus irritation.  Sore throat.  Loss of voice (laryngitis).  Cough.  Fatigue.  Muscle aches.  Loss of appetite.  Headache.  Low-grade fever. DIAGNOSIS  You might diagnose your own cold based on familiar symptoms, since most people get a cold 2 to 3 times a year. Your caregiver can confirm this based on your exam. Most importantly, your caregiver can check that your symptoms are not due to another disease such as strep throat, sinusitis, pneumonia, asthma, or epiglottitis. Blood tests, throat tests, and X-rays are not necessary to diagnose a common cold, but they may sometimes be helpful in excluding other more serious diseases. Your caregiver will decide if any further tests are required. RISKS AND COMPLICATIONS  You may be at risk for a more severe case of the common cold if you smoke cigarettes, have chronic heart disease (such as heart failure) or lung disease (such as asthma), or if  you have a weakened immune system. The very young and very old are also at risk for more serious infections. Bacterial sinusitis, middle ear infections, and bacterial pneumonia can complicate the common cold. The common cold can worsen asthma and chronic obstructive pulmonary disease (COPD). Sometimes, these complications can require emergency medical care and may be life-threatening. PREVENTION  The best way to protect against getting a cold is to practice good hygiene. Avoid oral or hand contact with people with cold symptoms. Wash your hands often if contact occurs. There is no clear evidence that vitamin C, vitamin E, echinacea, or exercise reduces the chance of developing a cold. However, it is always recommended to get plenty of rest and practice good nutrition. TREATMENT  Treatment is directed at relieving symptoms. There is no cure. Antibiotics are not effective, because the infection is caused by a virus, not by bacteria. Treatment may include:  Increased fluid intake. Sports drinks offer valuable electrolytes, sugars, and fluids.  Breathing heated mist or steam (vaporizer or shower).  Eating chicken soup or other clear broths, and maintaining good nutrition.  Getting plenty of rest.  Using gargles or lozenges for comfort.  Controlling fevers with ibuprofen or acetaminophen as directed by your caregiver.  Increasing usage of your inhaler if you have asthma. Zinc gel and zinc lozenges, taken in the first 24 hours of the common cold, can shorten the duration and lessen the severity of symptoms. Pain medicines may help with fever, muscle aches, and throat pain. A variety of non-prescription medicines are available to treat congestion and runny nose. Your caregiver   can make recommendations and may suggest nasal or lung inhalers for other symptoms.  HOME CARE INSTRUCTIONS   Only take over-the-counter or prescription medicines for pain, discomfort, or fever as directed by your  caregiver.  Use a warm mist humidifier or inhale steam from a shower to increase air moisture. This may keep secretions moist and make it easier to breathe.  Drink enough water and fluids to keep your urine clear or pale yellow.  Rest as needed.  Return to work when your temperature has returned to normal or as your caregiver advises. You may need to stay home longer to avoid infecting others. You can also use a face mask and careful hand washing to prevent spread of the virus. SEEK MEDICAL CARE IF:   After the first few days, you feel you are getting worse rather than better.  You need your caregiver's advice about medicines to control symptoms.  You develop chills, worsening shortness of breath, or brown or red sputum. These may be signs of pneumonia.  You develop yellow or brown nasal discharge or pain in the face, especially when you bend forward. These may be signs of sinusitis.  You develop a fever, swollen neck glands, pain with swallowing, or white areas in the back of your throat. These may be signs of strep throat. SEEK IMMEDIATE MEDICAL CARE IF:   You have a fever.  You develop severe or persistent headache, ear pain, sinus pain, or chest pain.  You develop wheezing, a prolonged cough, cough up blood, or have a change in your usual mucus (if you have chronic lung disease).  You develop sore muscles or a stiff neck. Document Released: 05/04/2001 Document Revised: 01/31/2012 Document Reviewed: 02/13/2014 Exit[employer] Patient Information 2015 [employer]. This information is not intended to replace advice given to you by your health care provider. Make sure you discuss any questions you have with your health care provider.  

## 2014-12-13 IMAGING — US US SCROTUM
1 series · 13 of 25 positions shown · non-contrast
Comparison: None.

CLINICAL DATA: Left testicular pain

SCROTAL ULTRASOUND
DOPPLER ULTRASOUND OF THE TESTICLES
TECHNIQUE: Complete ultrasound examination of the testicles,
epididymis, and other scrotal structures was performed.  Color and
spectral Doppler ultrasound were also utilized to evaluate blood
flow to the testicles.

[Series 1: us scrotum · 0.08mm/px · 13 of 53 slices shown]
[im 1/53]
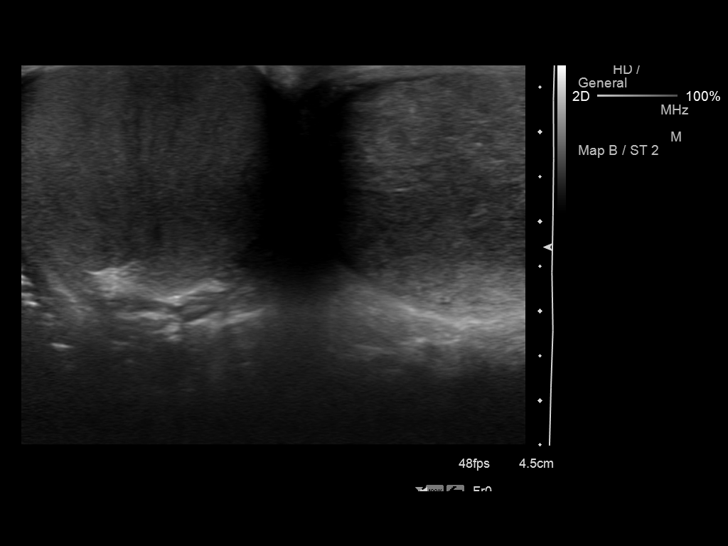
[im 5/53]
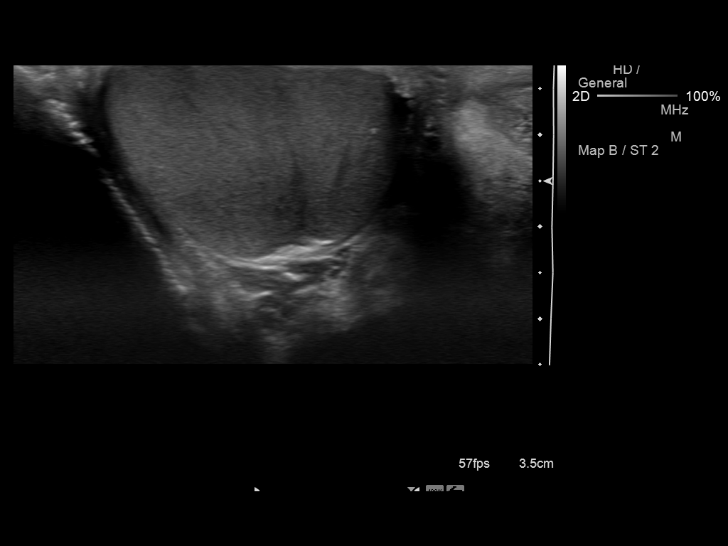
[im 9/53]
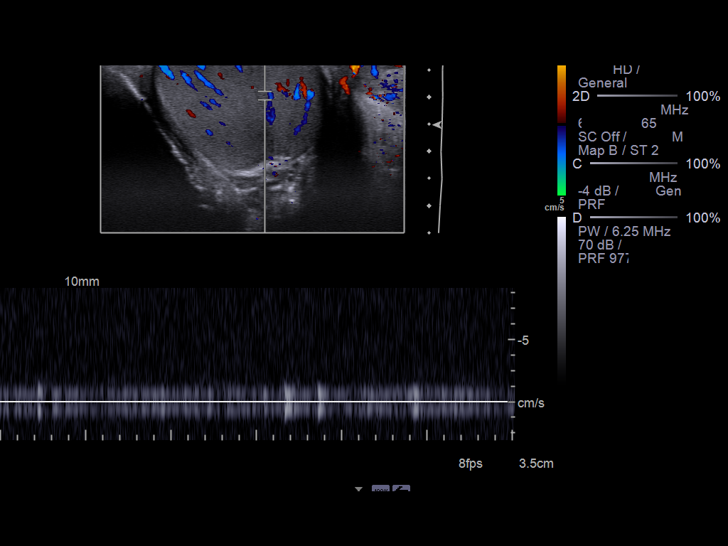
[im 14/53]
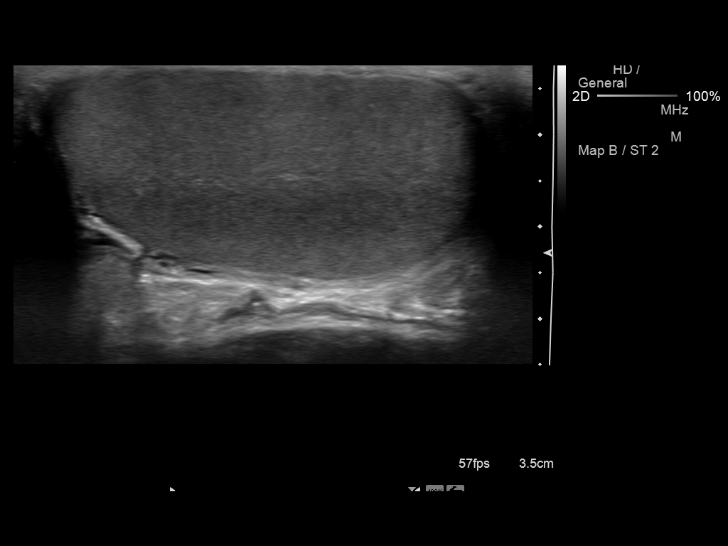
[im 18/53]
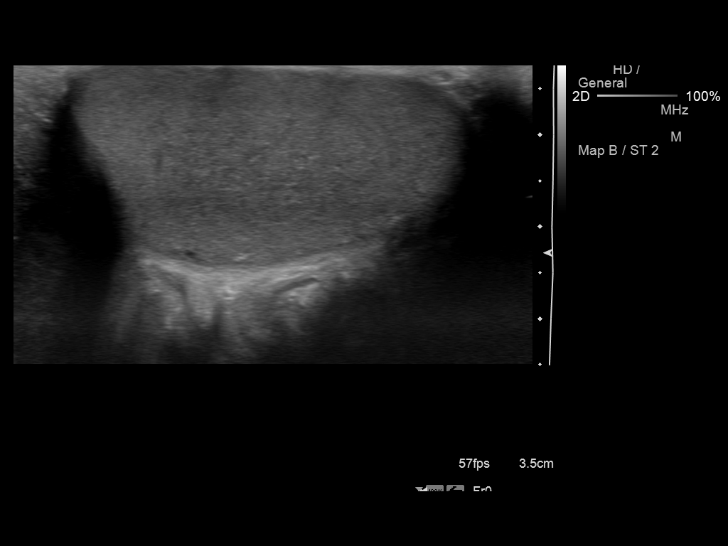
[im 22/53]
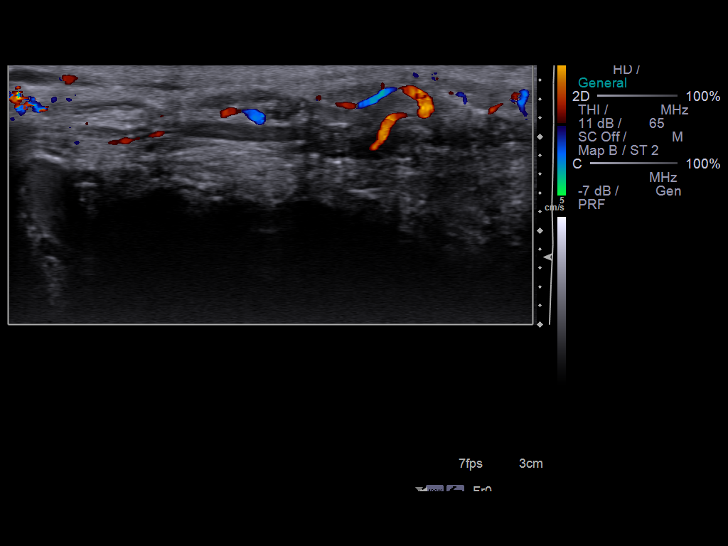
[im 27/53]
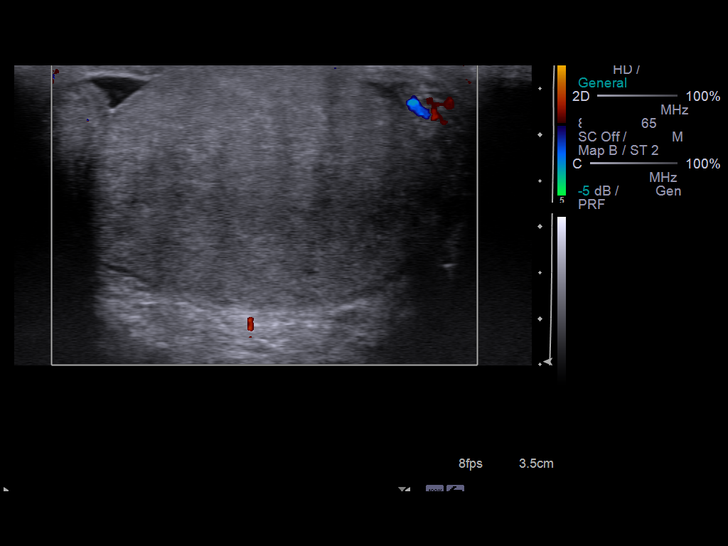
[im 31/53]
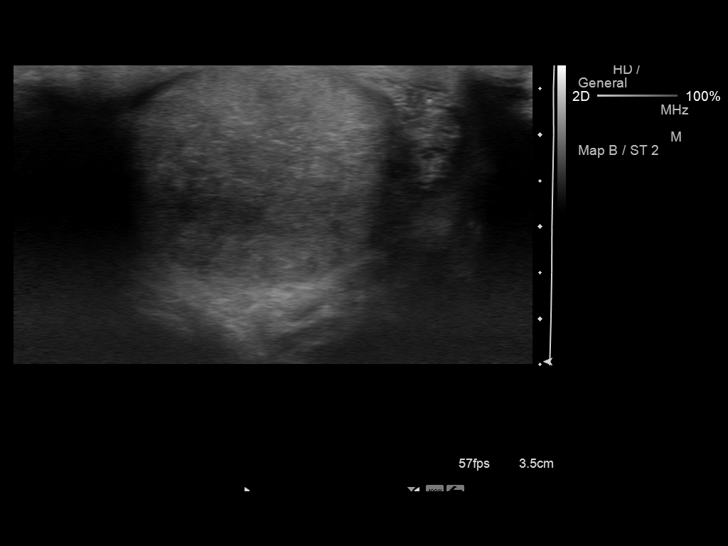
[im 35/53]
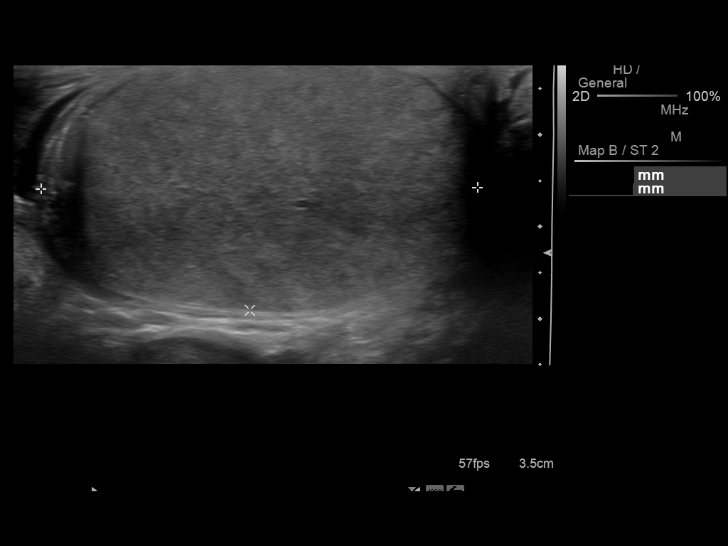
[im 40/53]
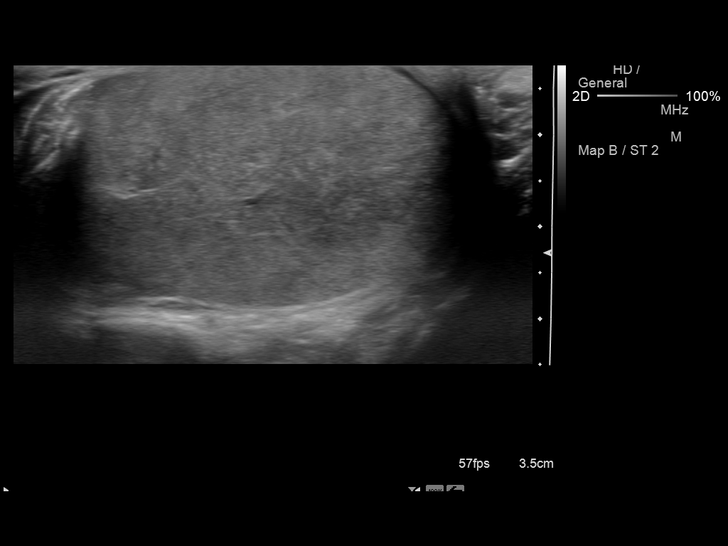
[im 44/53]
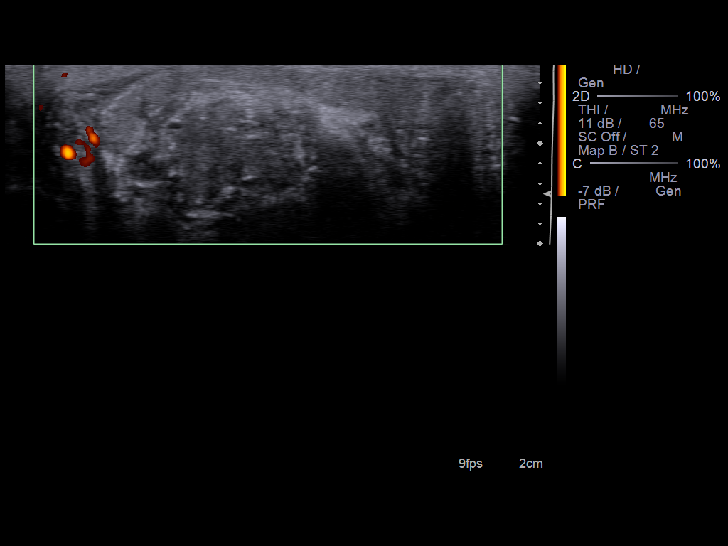
[im 48/53]
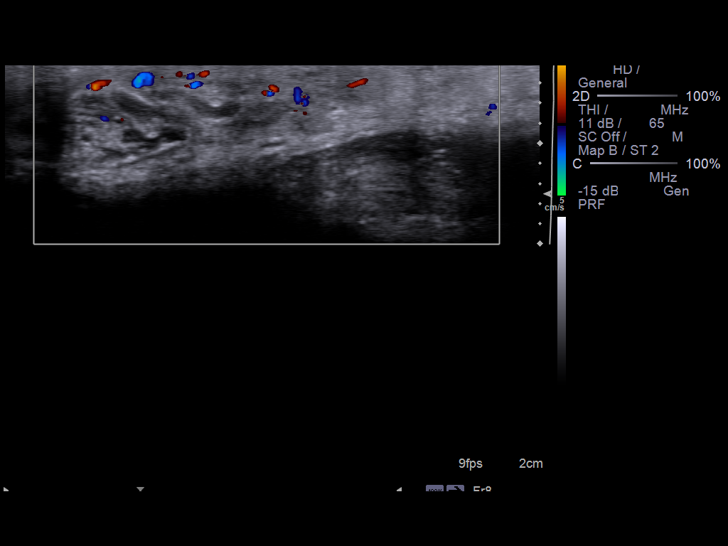
[im 53/53]
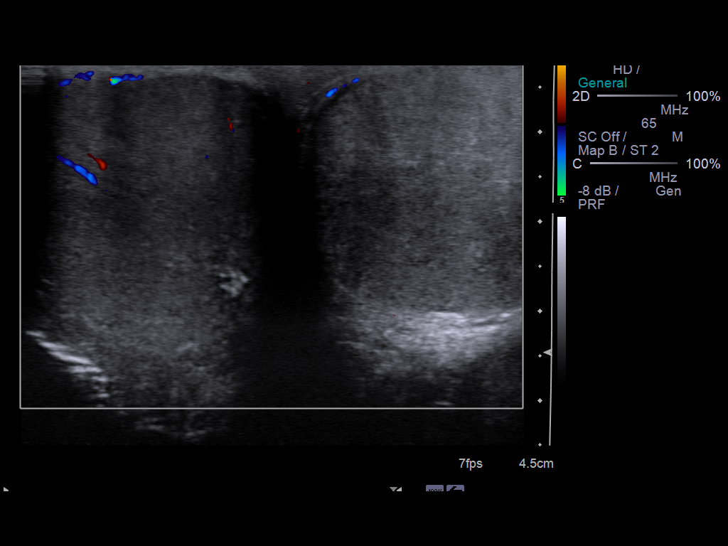

[13 of 25 positions shown; findings below may reference images not displayed]

FINDINGS: Right testis:  Normal in size appearance, measuring 4.9 x 2.2 x
cm.

Left testis:  Mildly enlarged with heterogeneous echotexture,
measuring 4.8 x 2.8 x 3.7 cm.

Right epididymis:  Normal in size and appearance.

Left epididymis:  Normal in size and appearance.

Hydrocele:  Absent.

Varicocele:  Absent.

Pulsed Doppler interrogation of both testes demonstrates normal low
resistance flow in the right testis.  Lack of demonstrable color or
spectral Doppler flow in the left testis.
IMPRESSION: Lack of color or spectral Doppler flow in the left testis, highly
suspicious for left testicular torsion.

Critical Value/emergent results were called by telephone at the
time of interpretation on 03/01/2013 at 3002 hours to Dr. Sethi,
who verbally acknowledged these results.  At his request, the
patient was notified directly and transported to the ED.

## 2016-01-13 ENCOUNTER — Ambulatory Visit (INDEPENDENT_AMBULATORY_CARE_PROVIDER_SITE_OTHER): Payer: BC Managed Care – PPO | Admitting: Physician Assistant

## 2016-01-13 VITALS — BP 132/80 | HR 59 | Temp 98.7°F | Resp 16 | Ht 69.0 in | Wt 182.0 lb

## 2016-01-13 DIAGNOSIS — R29898 Other symptoms and signs involving the musculoskeletal system: Secondary | ICD-10-CM

## 2016-01-13 DIAGNOSIS — Z113 Encounter for screening for infections with a predominantly sexual mode of transmission: Secondary | ICD-10-CM | POA: Diagnosis not present

## 2016-01-13 DIAGNOSIS — Z0000 Encounter for general adult medical examination without abnormal findings: Secondary | ICD-10-CM | POA: Diagnosis not present

## 2016-01-13 LAB — COMPREHENSIVE METABOLIC PANEL
ALK PHOS: 57 U/L (ref 40–115)
ALT: 42 U/L (ref 9–46)
AST: 37 U/L (ref 10–40)
Albumin: 4.7 g/dL (ref 3.6–5.1)
BUN: 17 mg/dL (ref 7–25)
CO2: 27 mmol/L (ref 20–31)
CREATININE: 1.16 mg/dL (ref 0.60–1.35)
Calcium: 9.9 mg/dL (ref 8.6–10.3)
Chloride: 100 mmol/L (ref 98–110)
GLUCOSE: 96 mg/dL (ref 65–99)
Potassium: 4.5 mmol/L (ref 3.5–5.3)
SODIUM: 138 mmol/L (ref 135–146)
TOTAL PROTEIN: 7.3 g/dL (ref 6.1–8.1)
Total Bilirubin: 0.5 mg/dL (ref 0.2–1.2)

## 2016-01-13 LAB — CBC
HCT: 47.2 % (ref 39.0–52.0)
Hemoglobin: 16.4 g/dL (ref 13.0–17.0)
MCH: 30 pg (ref 26.0–34.0)
MCHC: 34.7 g/dL (ref 30.0–36.0)
MCV: 86.4 fL (ref 78.0–100.0)
MPV: 9.5 fL (ref 8.6–12.4)
Platelets: 192 K/uL (ref 150–400)
RBC: 5.46 MIL/uL (ref 4.22–5.81)
RDW: 12.8 % (ref 11.5–15.5)
WBC: 2.3 K/uL — ABNORMAL LOW (ref 4.0–10.5)

## 2016-01-13 LAB — HIV ANTIBODY (ROUTINE TESTING W REFLEX): HIV 1&2 Ab, 4th Generation: NONREACTIVE

## 2016-01-13 LAB — HEPATITIS C ANTIBODY: HCV Ab: NEGATIVE

## 2016-01-13 LAB — HEPATITIS B SURFACE ANTIGEN: Hepatitis B Surface Ag: NEGATIVE

## 2016-01-13 NOTE — Progress Notes (Signed)
[facility] [address], [place] [address] [phone]  Date:  01/13/2016   Name:  John Cooke   DOB:  1991/08/25   MRN:  [id number]  PCP:  No PCP Per Patient    Chief Complaint: Annual Exam   History of Present Illness:  This is a 25 y.o. male who is presenting for CPE.   Wants to go to physical therapy school. He is currently a Art therapist. He is finishing up pre-requisite courses.  Left knee with chronic popping. States was hurt in the past a few times. Last hurt 2 years ago. Has chronic popping. No pain. No instability. Able to do everything he wants to do. He is wondering whether he needs referral to ortho.  Complaints:  no Immunizations: flu 07/2015, 06/2009 Dentist: every 6 months Eye: does not wear glasses or contacts but states that he used to Diet: tries to eat healthy  Exercise: 5-6 days a week. Weight lifting and cardio. He is a Systems analyst. Fam hx: No hx cancer, DM2, HTN, CVA, CAD, MI. Sexual hx: Wants STD testing. Tobacco/alcohol/substance use: no/yes - 2-3 drinks per week/no  Review of Systems:  Review of Systems  Constitutional: Negative.   HENT: Negative.   Eyes: Negative.   Respiratory: Negative.   Cardiovascular: Negative.   Gastrointestinal: Negative.   Endocrine: Negative.   Genitourinary: Negative.   Musculoskeletal:       Left knee popping  Skin: Negative.   Allergic/Immunologic: Negative.   Neurological: Negative.   Hematological: Negative.   Psychiatric/Behavioral: Negative.     There are no active problems to display for this patient.   Prior to Admission medications   Not on File    No Known Allergies  Past Surgical History  Procedure Laterality Date  . Tosillectomy Bilateral   . Scrotal exploration Bilateral 03/01/2013    Procedure: SCROTUM EXPLORATION;  Surgeon: Garnett Farm, MD;  Location: [facility] OR;  Service: Urology;  Laterality: Bilateral;  . Orchiopexy Right 03/01/2013   Procedure: ORCHIOPEXY ADULT;  Surgeon: Garnett Farm, MD;  [facility] OR;  Service: Urology;  Laterality: Right;  . Orchiectomy Left 03/01/2013    Procedure: ORCHIECTOMY;  Surgeon: Garnett Farm, MD;  Location: [facility] OR;  Service: Urology;  Laterality: Left;    Social History  Substance Use Topics  . Smoking status: Never Smoker   . Smokeless tobacco: None  . Alcohol Use: Yes    History reviewed. No pertinent family history.  Medication list has been reviewed and updated.  Physical Examination:  Physical Exam  Constitutional: He is oriented to person, place, and time. He appears well-developed and well-nourished. No distress.  HENT:  Head: Normocephalic and atraumatic.  Right Ear: Hearing, tympanic membrane, external ear and ear canal normal.  Left Ear: Hearing, tympanic membrane, external ear and ear canal normal.  Nose: Nose normal.  Mouth/Throat: Uvula is midline, oropharynx is clear and moist and mucous membranes are normal.  Eyes: Conjunctivae, EOM and lids are normal. Pupils are equal, round, and reactive to light. Right eye exhibits no discharge. Left eye exhibits no discharge. No scleral icterus.  Neck: Trachea normal and normal range of motion. Neck supple. Carotid bruit is not present. No thyromegaly present.  Cardiovascular: Normal rate, regular rhythm, normal heart sounds, intact distal pulses and normal pulses.   No murmur heard. Pulmonary/Chest: Effort normal and breath sounds normal. No respiratory distress. He has no wheezes. He has no rhonchi. He has no  rales.  Abdominal: Soft. Normal appearance and bowel sounds are normal. He exhibits no abdominal bruit. There is no tenderness.  Musculoskeletal: Normal range of motion. He exhibits no edema or tenderness.  Lymphadenopathy:       Head (right side): No submental, no submandibular, no tonsillar, no preauricular, no posterior auricular and no occipital adenopathy present.       Head (left side): No submental, no  submandibular, no tonsillar, no preauricular, no posterior auricular and no occipital adenopathy present.    He has no cervical adenopathy.       Right: No supraclavicular adenopathy present.       Left: No supraclavicular adenopathy present.  Neurological: He is alert and oriented to person, place, and time. No cranial nerve deficit. Coordination and gait normal.  Skin: Skin is warm, dry and intact. No lesion and no rash noted.  Psychiatric: He has a normal mood and affect. His speech is normal and behavior is normal. Judgment and thought content normal.   BP 132/80 mmHg  Pulse 59  Temp(Src) 98.7 F (37.1 C) (Oral)  Resp 16  Ht  (1.753 m)  Wt 182 lb (82.555 kg)  BMI 26.86 kg/m2  SpO2 98%   Visual Acuity Screening   Right eye Left eye Both eyes  Without correction: 20/30-1 20/30-1 20/25  With correction:      Assessment and Plan:  1. Annual physical exam - CBC - Comprehensive metabolic panel  2. Screen for STD (sexually transmitted disease) - RPR - GC/Chlamydia Probe Amp - Hepatitis B surface antigen - Hepatitis C antibody - HIV antibody  3. Popping sound of knee joint No pain. No instability. Able to do everything he wants to do. No need for referral to ortho unless develops pain.   [place]ke Brackett, MHS [facility]  01/13/2016

## 2016-01-13 NOTE — Patient Instructions (Signed)
I will call you with result of your lab tests. Return as needed. Will refer to ortho if you develop knee pain. Good luck with getting into PA school and your move to [facility]!!!

## 2016-01-14 LAB — GC/CHLAMYDIA PROBE AMP
CT PROBE, AMP APTIMA: NOT DETECTED
GC PROBE AMP APTIMA: NOT DETECTED

## 2016-01-14 LAB — RPR
